# Patient Record
Sex: Male | Born: 1955 | Race: White | Hispanic: No | Smoking: Former smoker
Health system: Southern US, Community
[De-identification: ages and names within clinical notes are randomized; demographics above are authoritative.]

## PROBLEM LIST (undated history)

## (undated) DIAGNOSIS — B019 Varicella without complication: Secondary | ICD-10-CM

## (undated) DIAGNOSIS — A630 Anogenital (venereal) warts: Secondary | ICD-10-CM

## (undated) DIAGNOSIS — F32A Depression, unspecified: Secondary | ICD-10-CM

## (undated) DIAGNOSIS — E785 Hyperlipidemia, unspecified: Secondary | ICD-10-CM

## (undated) DIAGNOSIS — F1910 Other psychoactive substance abuse, uncomplicated: Secondary | ICD-10-CM

## (undated) DIAGNOSIS — F329 Major depressive disorder, single episode, unspecified: Secondary | ICD-10-CM

## (undated) DIAGNOSIS — K219 Gastro-esophageal reflux disease without esophagitis: Secondary | ICD-10-CM

## (undated) DIAGNOSIS — F1010 Alcohol abuse, uncomplicated: Secondary | ICD-10-CM

## (undated) HISTORY — DX: Depression, unspecified: F32.A

## (undated) HISTORY — PX: TONSILLECTOMY AND ADENOIDECTOMY: SUR1326

## (undated) HISTORY — DX: Alcohol abuse, uncomplicated: F10.10

## (undated) HISTORY — DX: Varicella without complication: B01.9

## (undated) HISTORY — DX: Hyperlipidemia, unspecified: E78.5

## (undated) HISTORY — DX: Major depressive disorder, single episode, unspecified: F32.9

## (undated) HISTORY — DX: Anogenital (venereal) warts: A63.0

## (undated) HISTORY — DX: Gastro-esophageal reflux disease without esophagitis: K21.9

## (undated) HISTORY — PX: NOSE SURGERY: SHX723

## (undated) HISTORY — DX: Other psychoactive substance abuse, uncomplicated: F19.10

---
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
## 1996-11-17 HISTORY — PX: CHOLECYSTECTOMY: SHX55

## 1998-06-20 ENCOUNTER — Ambulatory Visit (HOSPITAL_COMMUNITY): Admission: RE | Admit: 1998-06-20 | Discharge: 1998-06-20 | Payer: Self-pay | Admitting: Family Medicine

## 1998-07-16 ENCOUNTER — Encounter: Payer: Self-pay | Admitting: General Surgery

## 1998-07-17 ENCOUNTER — Encounter: Payer: Self-pay | Admitting: General Surgery

## 1998-07-17 ENCOUNTER — Ambulatory Visit (HOSPITAL_COMMUNITY): Admission: RE | Admit: 1998-07-17 | Discharge: 1998-07-18 | Payer: Self-pay | Admitting: General Surgery

## 1999-12-14 ENCOUNTER — Emergency Department (HOSPITAL_COMMUNITY): Admission: EM | Admit: 1999-12-14 | Discharge: 1999-12-14 | Payer: Self-pay

## 2000-02-05 ENCOUNTER — Ambulatory Visit: Admission: RE | Admit: 2000-02-05 | Discharge: 2000-02-05 | Payer: Self-pay | Admitting: Family Medicine

## 2013-08-17 ENCOUNTER — Encounter: Payer: Self-pay | Admitting: Family Medicine

## 2013-08-17 ENCOUNTER — Ambulatory Visit (INDEPENDENT_AMBULATORY_CARE_PROVIDER_SITE_OTHER): Payer: BC Managed Care – PPO | Admitting: Family Medicine

## 2013-08-17 VITALS — BP 110/60 | HR 61 | Temp 97.8°F | Ht 73.0 in | Wt 200.0 lb

## 2013-08-17 DIAGNOSIS — F1910 Other psychoactive substance abuse, uncomplicated: Secondary | ICD-10-CM

## 2013-08-17 DIAGNOSIS — R229 Localized swelling, mass and lump, unspecified: Secondary | ICD-10-CM

## 2013-08-17 DIAGNOSIS — F1911 Other psychoactive substance abuse, in remission: Secondary | ICD-10-CM

## 2013-08-17 DIAGNOSIS — Z8659 Personal history of other mental and behavioral disorders: Secondary | ICD-10-CM

## 2013-08-17 DIAGNOSIS — E785 Hyperlipidemia, unspecified: Secondary | ICD-10-CM | POA: Insufficient documentation

## 2013-08-17 DIAGNOSIS — F1011 Alcohol abuse, in remission: Secondary | ICD-10-CM | POA: Insufficient documentation

## 2013-08-17 DIAGNOSIS — Z0000 Encounter for general adult medical examination without abnormal findings: Secondary | ICD-10-CM

## 2013-08-17 DIAGNOSIS — K219 Gastro-esophageal reflux disease without esophagitis: Secondary | ICD-10-CM | POA: Insufficient documentation

## 2013-08-17 DIAGNOSIS — G4733 Obstructive sleep apnea (adult) (pediatric): Secondary | ICD-10-CM

## 2013-08-17 LAB — POCT URINALYSIS DIPSTICK
Bilirubin, UA: NEGATIVE
Blood, UA: NEGATIVE
Glucose, UA: NEGATIVE
Ketones, UA: NEGATIVE
Leukocytes, UA: NEGATIVE
Nitrite, UA: NEGATIVE
Protein, UA: NEGATIVE
Spec Grav, UA: 1.02
Urobilinogen, UA: 0.2
pH, UA: 6

## 2013-08-17 LAB — CBC WITH DIFFERENTIAL/PLATELET
Basophils Absolute: 0 10*3/uL (ref 0.0–0.1)
Basophils Relative: 0.8 % (ref 0.0–3.0)
Eosinophils Absolute: 0.4 10*3/uL (ref 0.0–0.7)
Eosinophils Relative: 6.1 % — ABNORMAL HIGH (ref 0.0–5.0)
HCT: 44.3 % (ref 39.0–52.0)
Hemoglobin: 15 g/dL (ref 13.0–17.0)
Lymphocytes Relative: 48.2 % — ABNORMAL HIGH (ref 12.0–46.0)
Lymphs Abs: 2.8 10*3/uL (ref 0.7–4.0)
MCHC: 33.9 g/dL (ref 30.0–36.0)
MCV: 93.8 fl (ref 78.0–100.0)
Monocytes Absolute: 0.7 10*3/uL (ref 0.1–1.0)
Monocytes Relative: 11.7 % (ref 3.0–12.0)
Neutro Abs: 1.9 10*3/uL (ref 1.4–7.7)
Neutrophils Relative %: 33.2 % — ABNORMAL LOW (ref 43.0–77.0)
Platelets: 208 10*3/uL (ref 150.0–400.0)
RBC: 4.72 Mil/uL (ref 4.22–5.81)
RDW: 13.1 % (ref 11.5–14.6)
WBC: 5.8 10*3/uL (ref 4.5–10.5)

## 2013-08-17 NOTE — Progress Notes (Signed)
Subjective:    Patient ID: Jose Moss, male    DOB: 07-Sep-1956, 57 y.o.   MRN: [id number]  HPI Patient is seen to establish care and for wellness visit. He has past history of depression currently stable off medication. His GERD symptoms are controlled with over-the-counter medications. He has reported hyperlipidemia but has not been treated with statins. He reports past history of obstructive sleep apnea. He has not used CPAP in years. 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 He remains in a support program.  Patient never had screening colonoscopy. He thinks last tetanus was within the past 5 or 6 years. No recent screening lab work.  Patient is divorced. He has one child. Former smoker. No alcohol or drug use in 10 years.  Family history significant for father with hyperlipidemia and heart disease around age 60   Review of Systems  Constitutional: Negative for fever, activity change, appetite change, fatigue and unexpected weight change.  HENT: Negative for ear pain, congestion and trouble swallowing.   Eyes: Negative for pain and visual disturbance.  Respiratory: Negative for cough, shortness of breath and wheezing.   Cardiovascular: Negative for chest pain and palpitations.  Gastrointestinal: Negative for nausea, vomiting, abdominal pain, diarrhea, constipation, blood in stool, abdominal distention and rectal pain.  Endocrine: Negative for polydipsia and polyuria.  Genitourinary: Negative for dysuria, hematuria and testicular pain.  Musculoskeletal: Negative for joint swelling and arthralgias.  Skin: Negative for rash.  Neurological: Negative for dizziness, syncope and headaches.  Hematological: Negative for adenopathy.  Psychiatric/Behavioral: Negative for confusion and dysphoric mood.       Objective:   Physical Exam  Constitutional: He is  oriented to person, place, and time. He appears well-developed and well-nourished. No distress.  HENT:  Head: Normocephalic and atraumatic.  Right Ear: External ear normal.  Left Ear: External ear normal.  Mouth/Throat: Oropharynx is clear and moist.  Eyes: Conjunctivae and EOM are normal. Pupils are equal, round, and reactive to light.  Neck: Normal range of motion. Neck supple. No thyromegaly present.  Cardiovascular: Normal rate, regular rhythm and normal heart sounds.   No murmur heard. Pulmonary/Chest: No respiratory distress. He has no wheezes. He has no rales.  Abdominal: Soft. Bowel sounds are normal. He exhibits no distension and no mass. There is no tenderness. There is no rebound and no guarding.  Genitourinary: Rectum normal and prostate normal.  Musculoskeletal: He exhibits no edema.  Lymphadenopathy:    He has no cervical adenopathy.  Neurological: He is alert and oriented to person, place, and time. He displays normal reflexes. No cranial nerve deficit.  Skin: No rash noted.  Patient has small slightly nodular lesion right cheek. Just superior to this he has slightly scaly slightly brown colored lesion  Psychiatric: He has a normal mood and affect.          Assessment & Plan:  #1 complete physical. Obtain screening labs. Confirm date of last tetanus. Flu vaccine declined. He wishes to wait on colonoscopy and will call when ready.  #2 history of reported obstructive sleep apnea. Not on CPAP in years. Set up referral to sleep specialist for further evaluation  #3 nodular lesion right face. Rule out basal cell carcinoma. Referral [facility]  #4 history of substance abuse with alcohol and cocaine now absent for 10 years  #5 GERD which is been stable and  controlled with over-the-counter medications  #6 history of hyperlipidemia. Repeat lipids with labs above

## 2013-08-17 NOTE — Patient Instructions (Addendum)
We will call you regarding dermatology and pulmonary (sleep apnea) referrals.

## 2013-08-18 LAB — HEPATIC FUNCTION PANEL
ALT: 19 U/L (ref 0–53)
AST: 18 U/L (ref 0–37)
Albumin: 4.3 g/dL (ref 3.5–5.2)
Alkaline Phosphatase: 64 U/L (ref 39–117)
Bilirubin, Direct: 0.1 mg/dL (ref 0.0–0.3)
Total Bilirubin: 0.6 mg/dL (ref 0.3–1.2)
Total Protein: 6.7 g/dL (ref 6.0–8.3)

## 2013-08-18 LAB — TSH: TSH: 1.62 u[IU]/mL (ref 0.35–5.50)

## 2013-08-18 LAB — LIPID PANEL
Cholesterol: 278 mg/dL — ABNORMAL HIGH (ref 0–200)
HDL: 36.6 mg/dL — ABNORMAL LOW (ref >39.00)
Total CHOL/HDL Ratio: 8
Triglycerides: 72 mg/dL (ref 0.0–149.0)
VLDL: 14.4 mg/dL (ref 0.0–40.0)

## 2013-08-18 LAB — PSA: PSA: 1.76 ng/mL (ref 0.10–4.00)

## 2013-08-18 LAB — BASIC METABOLIC PANEL
BUN: 17 mg/dL (ref 6–23)
CO2: 28 mEq/L (ref 19–32)
Calcium: 9.4 mg/dL (ref 8.4–10.5)
Chloride: 105 mEq/L (ref 96–112)
Creatinine, Ser: 0.9 mg/dL (ref 0.4–1.5)
GFR: 97.78 mL/min (ref >60.00)
Glucose, Bld: 86 mg/dL (ref 70–99)
Potassium: 4.9 mEq/L (ref 3.5–5.1)
Sodium: 138 mEq/L (ref 135–145)

## 2013-08-19 ENCOUNTER — Other Ambulatory Visit: Payer: Self-pay

## 2013-08-19 LAB — LDL CHOLESTEROL, DIRECT: Direct LDL: 232 mg/dL

## 2013-08-19 MED ORDER — ATORVASTATIN CALCIUM 40 MG PO TABS: 40.0000 mg | ORAL_TABLET | Freq: Every day | ORAL | Status: DC

## 2013-08-24 ENCOUNTER — Encounter: Payer: Self-pay | Admitting: Family Medicine

## 2013-09-26 ENCOUNTER — Ambulatory Visit (INDEPENDENT_AMBULATORY_CARE_PROVIDER_SITE_OTHER): Payer: BC Managed Care – PPO | Admitting: Pulmonary Disease

## 2013-09-26 ENCOUNTER — Encounter: Payer: Self-pay | Admitting: Pulmonary Disease

## 2013-09-26 VITALS — BP 108/80 | HR 70 | Temp 97.8°F | Ht 73.5 in | Wt 203.6 lb

## 2013-09-26 DIAGNOSIS — Z23 Encounter for immunization: Secondary | ICD-10-CM

## 2013-09-26 DIAGNOSIS — G4733 Obstructive sleep apnea (adult) (pediatric): Secondary | ICD-10-CM

## 2013-09-26 NOTE — Assessment & Plan Note (Signed)
The patient has a history of obstructive sleep apnea for which he was treated with CPAP.  He discontinued this because of poor tolerance, and subsequently lost 70 pounds.  He was feeling very well with restful sleep, but over the last year he is having more frequent awakenings and nonrestorative sleep.  He also has some sleepiness in the evenings with inactivity.  It is unclear to me whether sleep apnea explains his current symptoms, and the only way to assess this will be with a repeat sleep study.  The patient is agreeable to this approach.

## 2013-09-26 NOTE — Progress Notes (Signed)
Subjective:    Patient ID: Jose Moss, male    DOB: September 06, 1956, 57 y.o.   MRN: [id number]  HPI The patient is a 57 year old male who I've been asked to see for possible obstructive sleep apnea.  The patient was diagnosed with sleep apnea in the late 1990s, but he was almost 70 pounds heavier at that time.  He was started on CPAP, but found it difficult to get on a consistent schedule with the device.  He decided to work on aggressive weight loss, and lost approximately 70 pounds.  The patient did very well initially, but over the last year or so has had a drop in his energy level during the day, and also nocturnal symptoms that he is unable to explain.  He isn't sure if he snores or has apneas with no current bed partner.  He does still have frequent awakenings, and doesn't feel fully rested in the mornings upon arising.  He also describes a sense of worry and anxiety whenever he awakens in the mornings.  The patient denies having inappropriate daytime sleepiness, but will have sleep pressure in the evenings watching television or movies.  He has no sleepiness with driving.  He complains of chronic nasal congestion.  His Epworth score today is 10.   Sleep Questionnaire What time do you typically go to bed?( Between what hours) 10:30-1130 10:30-1130 at 1459 on 09/26/13 by Maisie Fus, CMA How long does it take you to fall asleep?  at 1459 on 09/26/13 by Maisie Fus, CMA How many times during the night do you wake up? 4 4 at 1459 on 09/26/13 by Maisie Fus, CMA What time do you get out of bed to start your day? 0630 0630 at 1459 on 09/26/13 by Maisie Fus, CMA Do you drive or operate heavy machinery in your occupation? No No at 1459 on 09/26/13 by Maisie Fus, CMA How much has your weight changed (up or down) over the past two years? (In pounds) 10 lb (4.536 kg)10 lb (4.536 kg) flucuates at 1459 on 09/26/13 by Maisie Fus, CMA Have you ever had a sleep  study before? Yes Yes at 1459 on 09/26/13 by Maisie Fus, CMA If yes, location of study? [facility] at 1459 on 09/26/13 by Maisie Fus, CMA If yes, date of study? 1998 1998 at 1459 on 09/26/13 by Maisie Fus, CMA Do you currently use CPAP? No No at 1459 on 09/26/13 by Maisie Fus, CMA Do you wear oxygen at any time? No No at 1459 on 09/26/13 by Maisie Fus, CMA   Review of Systems  Constitutional: Negative for fever and unexpected weight change.  HENT: Positive for congestion and dental problem. Negative for ear pain, nosebleeds, postnasal drip, rhinorrhea, sinus pressure, sneezing, sore throat and trouble swallowing.   Eyes: Negative for redness and itching.  Respiratory: Negative for cough, chest tightness, shortness of breath and wheezing.   Cardiovascular: Positive for palpitations ( irregular heartbeat). Negative for leg swelling.  Gastrointestinal: Positive for abdominal pain. Negative for nausea and vomiting.  Genitourinary: Negative for dysuria.  Musculoskeletal: Positive for joint swelling.  Skin: Negative for rash.  Neurological: Negative for headaches.  Hematological: Does not bruise/bleed easily.  Psychiatric/Behavioral: Negative for dysphoric mood. The patient is nervous/anxious.        Objective:   Physical Exam Constitutional:  Well developed, no acute distress  HENT:  Nares narrowed bilat, poor airflow.  Oropharynx without exudate, palate  and uvula very elongated.   Eyes:  Perrla, eomi, no scleral icterus  Neck:  No JVD, no TMG  Cardiovascular:  Normal rate, regular rhythm, no rubs or gallops.  No murmurs        Intact distal pulses  Pulmonary :  Normal breath sounds, no stridor or respiratory distress   No rales, rhonchi, or wheezing  Abdominal:  Soft, nondistended, bowel sounds present.  No tenderness noted.   Musculoskeletal:  No lower extremity edema noted.  Lymph Nodes:  No cervical lymphadenopathy noted  Skin:  No cyanosis  noted  Neurologic:  Alert, appropriate, moves all 4 extremities without obvious deficit.         Assessment & Plan:

## 2013-09-26 NOTE — Patient Instructions (Signed)
Will schedule for sleep study.  Will arrange followup to review once the results are available.

## 2013-09-30 ENCOUNTER — Other Ambulatory Visit (INDEPENDENT_AMBULATORY_CARE_PROVIDER_SITE_OTHER): Payer: BC Managed Care – PPO

## 2013-09-30 DIAGNOSIS — E785 Hyperlipidemia, unspecified: Secondary | ICD-10-CM

## 2013-09-30 LAB — LIPID PANEL
Cholesterol: 141 mg/dL (ref 0–200)
HDL: 31.3 mg/dL — ABNORMAL LOW (ref >39.00)
LDL Cholesterol: 101 mg/dL — ABNORMAL HIGH (ref 0–99)
Total CHOL/HDL Ratio: 5
Triglycerides: 43 mg/dL (ref 0.0–149.0)
VLDL: 8.6 mg/dL (ref 0.0–40.0)

## 2013-09-30 LAB — HEPATIC FUNCTION PANEL
ALT: 30 U/L (ref 0–53)
AST: 23 U/L (ref 0–37)
Albumin: 3.9 g/dL (ref 3.5–5.2)
Alkaline Phosphatase: 72 U/L (ref 39–117)
Bilirubin, Direct: 0 mg/dL (ref 0.0–0.3)
Total Bilirubin: 0.4 mg/dL (ref 0.3–1.2)
Total Protein: 6.9 g/dL (ref 6.0–8.3)

## 2013-10-18 ENCOUNTER — Telehealth: Payer: Self-pay | Admitting: Pulmonary Disease

## 2013-10-18 NOTE — Telephone Encounter (Signed)
Pt upset because no one can give a price of what it will cost him out of pocket for this study and has cancelled it-he was advised to ck his ins we did a precert and one was not required but pt refused to call them himself about his deductable etc which is something we dont know T[place] Sos

## 2013-10-27 ENCOUNTER — Encounter (HOSPITAL_BASED_OUTPATIENT_CLINIC_OR_DEPARTMENT_OTHER): Payer: BC Managed Care – PPO

## 2013-11-08 ENCOUNTER — Telehealth: Payer: Self-pay | Admitting: Family Medicine

## 2013-11-08 NOTE — Telephone Encounter (Signed)
Pt is call requesting last blood work results

## 2013-11-11 NOTE — Telephone Encounter (Signed)
Pt reminded of previous lab results.

## 2013-12-04 ENCOUNTER — Ambulatory Visit (HOSPITAL_BASED_OUTPATIENT_CLINIC_OR_DEPARTMENT_OTHER): Payer: BC Managed Care – PPO | Attending: Pulmonary Disease

## 2013-12-04 VITALS — Ht 73.0 in | Wt 200.0 lb

## 2013-12-04 DIAGNOSIS — G4733 Obstructive sleep apnea (adult) (pediatric): Secondary | ICD-10-CM

## 2013-12-04 DIAGNOSIS — Z6826 Body mass index (BMI) 26.0-26.9, adult: Secondary | ICD-10-CM | POA: Insufficient documentation

## 2013-12-11 DIAGNOSIS — G4733 Obstructive sleep apnea (adult) (pediatric): Secondary | ICD-10-CM

## 2013-12-12 ENCOUNTER — Telehealth: Payer: Self-pay | Admitting: Pulmonary Disease

## 2013-12-12 NOTE — Telephone Encounter (Signed)
appt scheduled with [place] to review sleep study 12/19/13 at 430

## 2013-12-12 NOTE — Sleep Study (Signed)
   NAME: Jose Moss DATE OF BIRTH:  04-03-1956 MEDICAL RECORD NUMBER [id number]  LOCATION: [facility]  PHYSICIAN: [place] OF STUDY: 12/04/2013  SLEEP STUDY TYPE: Nocturnal Polysomnogram               REFERRING PHYSICIAN: Verlan Grotz, Armando Reichert, MD  INDICATION FOR STUDY: hypersomnia with sleep apnea  EPWORTH SLEEPINESS SCORE:  8 HEIGHT: 6\' 1"  (185.4 cm)  WEIGHT: 200 lb (90.719 kg)    Body mass index is 26.39 kg/(m^2).  NECK SIZE: 15 in.  SLEEP ARCHITECTURE: The patient had a total sleep time of 370 minutes with no slow-wave sleep and 79 minutes of REM. Sleep onset latency was normal at 18 minutes, and REM onset was normal at 76 minutes. Sleep efficiency was minimally reduced at 90%.  RESPIRATORY DATA: The patient was found to have 122 apneas and 39 obstructive hypopneas, giving him an apnea-hypopnea index of 26 events per hour. The events occurred all in the supine position there was loud snoring noted throughout.  OXYGEN DATA: There was oxygen desaturation as low as 80% with the patient's obstructive events  CARDIAC DATA: Rare PVC noted, but no clinically significant arrhythmias were seen  MOVEMENT/PARASOMNIA: Moderate numbers of periodic limb movements were noted without significant arousal or awakening. There were no abnormal behaviors seen.  IMPRESSION/ RECOMMENDATION:    1) moderate obstructive sleep apnea/hypopnea syndrome, with an AHI of 26 events per hour and oxygen desaturation as low as 80%. Treatment for this degree of sleep apnea can include a trial of weight loss alone if applicable, upper airway surgery, dental appliance, and also CPAP. Clinical correlation is suggested.  2) rare PVC noted, but no clinically significant arrhythmias were seen.     [place], American Board of Sleep Medicine  ELECTRONICALLY SIGNED ON:  12/12/2013, 8:38 AM [facility] PH: [phone]   FX: [phone]  [phone] [place]

## 2013-12-12 NOTE — Telephone Encounter (Signed)
Needs ov to review sleep study results.  

## 2013-12-19 ENCOUNTER — Encounter: Payer: Self-pay | Admitting: Pulmonary Disease

## 2013-12-19 ENCOUNTER — Ambulatory Visit (INDEPENDENT_AMBULATORY_CARE_PROVIDER_SITE_OTHER): Payer: BC Managed Care – PPO | Admitting: Pulmonary Disease

## 2013-12-19 VITALS — BP 112/76 | HR 91 | Temp 97.7°F | Ht 71.0 in | Wt 207.4 lb

## 2013-12-19 DIAGNOSIS — G4733 Obstructive sleep apnea (adult) (pediatric): Secondary | ICD-10-CM

## 2013-12-19 NOTE — Patient Instructions (Signed)
Will setup on cpap.  Please call if having tolerance issues.  Work on modest weight loss. followup with me again in 8 weeks.

## 2013-12-19 NOTE — Assessment & Plan Note (Signed)
The patient has been found to have moderate obstructive sleep apnea, and I have reviewed the study with him in detail. I have outlined possible treatment options with him, including a trial of CPAP and also a dental appliance. After a long discussion, the patient has agreed to try the CPAP for the next 8 weeks to see if he has significant benefit and is able to tolerate. I've also encouraged him to work aggressively on modest weight loss. I will set the patient up on cpap at a moderate pressure level to allow for desensitization, and will troubleshoot the device over the next 4-6weeks if needed.  The pt is to call me if having issues with tolerance.  Will then optimize the pressure once patient is able to wear cpap on a consistent basis.

## 2013-12-19 NOTE — Progress Notes (Signed)
   Subjective:    Patient ID: Jose Moss, male    DOB: 09/18/1956, 58 y.o.   MRN: [id number]  HPI The patient comes in today for followup after his recent sleep study. He was found to have moderate OSA, with an AHI of 26 events per hour and oxygen desaturation as low as 80%. I have reviewed the study with him in detail, and answered all of his questions.    Review of Systems  Constitutional: Negative for fever and unexpected weight change.  HENT: Negative for congestion, dental problem, ear pain, nosebleeds, postnasal drip, rhinorrhea, sinus pressure, sneezing, sore throat and trouble swallowing.   Eyes: Negative for redness and itching.  Respiratory: Negative for cough, chest tightness, shortness of breath and wheezing.   Cardiovascular: Negative for palpitations and leg swelling.  Gastrointestinal: Negative for nausea and vomiting.  Genitourinary: Negative for dysuria.  Musculoskeletal: Negative for joint swelling.  Skin: Negative for rash.  Neurological: Negative for headaches.  Hematological: Does not bruise/bleed easily.  Psychiatric/Behavioral: Negative for dysphoric mood. The patient is not nervous/anxious.        Objective:   Physical Exam Overweight male in nad Nose without purulence or d/c noted No skin breakdown or pressure necrosis from cpap mask. Neck without LN or TMG LE without edema, no cyanosis.  Alert and oriented, moves all 4.        Assessment & Plan:

## 2013-12-23 ENCOUNTER — Other Ambulatory Visit: Payer: Self-pay | Admitting: Pulmonary Disease

## 2013-12-23 ENCOUNTER — Telehealth: Payer: Self-pay | Admitting: Pulmonary Disease

## 2013-12-23 DIAGNOSIS — G4733 Obstructive sleep apnea (adult) (pediatric): Secondary | ICD-10-CM

## 2013-12-23 NOTE — Telephone Encounter (Signed)
Order sent for dental appliance.

## 2013-12-23 NOTE — Telephone Encounter (Signed)
We received a MyChart message from the pt. He wants to cancel the order for CPAP and try an oral appliance.  [place] - please advise. Thanks.

## 2014-02-10 ENCOUNTER — Encounter: Payer: Self-pay | Admitting: Pulmonary Disease

## 2014-02-13 ENCOUNTER — Ambulatory Visit: Payer: BC Managed Care – PPO | Admitting: Pulmonary Disease

## 2014-03-07 ENCOUNTER — Encounter: Payer: Self-pay | Admitting: Pulmonary Disease

## 2014-03-08 NOTE — Telephone Encounter (Signed)
Will make krista aware of this.

## 2014-03-08 NOTE — Telephone Encounter (Signed)
Called spoke with Daleen Snook who reported that she is who pt needs to speak with regarding this. Per Daleen Snook, she has left a message for pt to call her back. Mychart message sent to pt to let him know this

## 2014-03-09 NOTE — Telephone Encounter (Signed)
Spoke with patient in regards to his [employer]. Patient stated he would have [place] call our office.  Nothing further was needed at this time.

## 2014-03-09 NOTE — Telephone Encounter (Signed)
Called patient again to talk to him about his bill. Unable to document billing information in patients chart.  Left message on machine for patient to call back and ask for me.

## 2014-03-28 ENCOUNTER — Encounter: Payer: Self-pay | Admitting: Pulmonary Disease

## 2014-03-28 DIAGNOSIS — G4733 Obstructive sleep apnea (adult) (pediatric): Secondary | ICD-10-CM

## 2014-03-28 NOTE — Telephone Encounter (Signed)
Per order 12/19/13: Note resmed s10 air/auto at 10cm with heated humidifier and climate control tubing. Needs full face mask of choice Enroll in airview program Not advanced or [facility]ria --   Pt now wanting the order for CPAP to be re-sent since insurance denied the dental appliance. Please advise if pt needs OV or if we can just place order again [facility]? thanks

## 2014-03-29 ENCOUNTER — Telehealth: Payer: Self-pay | Admitting: Pulmonary Disease

## 2014-03-29 NOTE — Telephone Encounter (Signed)
Ok to start cpap as I ordered last visit.  He needs to see me in 8 weeks, but call if having tolerance issues.

## 2014-03-29 NOTE — Telephone Encounter (Signed)
Let pt know that he needs to see me 8 weeks after his cpap is started.  Thanks.

## 2014-03-30 NOTE — Telephone Encounter (Signed)
Pt advised and states he will call when he is set-up.[place] Bing, CMA

## 2014-04-13 NOTE — Telephone Encounter (Signed)
Pt was set up with [facility]. They are giving him a hard time about money. Would like to be set up with another DME.

## 2014-04-13 NOTE — Addendum Note (Signed)
Addended by: Desmond Dike C on: 04/13/2014 02:57 PM   Modules accepted: Orders

## 2014-08-21 ENCOUNTER — Other Ambulatory Visit: Payer: Self-pay | Admitting: Family Medicine

## 2014-10-16 ENCOUNTER — Ambulatory Visit: Payer: BC Managed Care – PPO | Admitting: Family Medicine

## 2014-10-30 ENCOUNTER — Encounter: Payer: Self-pay | Admitting: Family Medicine

## 2014-10-30 ENCOUNTER — Ambulatory Visit (INDEPENDENT_AMBULATORY_CARE_PROVIDER_SITE_OTHER): Payer: BC Managed Care – PPO | Admitting: Family Medicine

## 2014-10-30 VITALS — BP 126/80 | HR 74 | Temp 97.6°F | Wt 203.0 lb

## 2014-10-30 DIAGNOSIS — R5383 Other fatigue: Secondary | ICD-10-CM

## 2014-10-30 DIAGNOSIS — R0609 Other forms of dyspnea: Secondary | ICD-10-CM

## 2014-10-30 DIAGNOSIS — E785 Hyperlipidemia, unspecified: Secondary | ICD-10-CM

## 2014-10-30 DIAGNOSIS — R0600 Dyspnea, unspecified: Secondary | ICD-10-CM

## 2014-10-30 NOTE — Progress Notes (Signed)
   Subjective:    Patient ID: Jose Moss, male    DOB: 21-May-1956, 58 y.o.   MRN: [id number]  HPI Patient seen with nonspecific complaints of fatigue and lethargy especially with exercise and activities. He does not exercise regularly but has noticed in recent months with things like walking he has increased fatigue and sometimes dyspnea with exertion. He has never had any chest pain. He was concerned because his father had CABG age 52. Patient is an ex-smoker after about 40 pack year history. He quit smoking around age 64. Patient does not have any history of known COPD. No recent cough.  He had several labs last year that were unremarkable with exception of severe hyperlipidemia. He takes aspirin and Lipitor. He has history of obstructive sleep apnea and is currently not treated with CPAP. He day denies any daytime somnolence. Occasional has nocturia- 2-3 times at night with urgency but no obstructive symptoms. Denies any active depression symptoms. No dietary changes.  Past Medical History  Diagnosis Date  . Alcohol abuse   . Drug abuse   . Chicken pox   . Depression   . Hyperlipidemia   . GERD (gastroesophageal reflux disease)   . Genital warts    Past Surgical History  Procedure Laterality Date  . Cholecystectomy  1998  . Appendectomy  1976  . Nose surgery      reports that he quit smoking about 7 years ago. His smoking use included Cigarettes. He has a 40 pack-year smoking history. He has never used smokeless tobacco. He reports that he does not drink alcohol or use illicit drugs. family history includes COPD in his mother; Heart disease in his father; Hyperlipidemia in his father. No Known Allergies    Review of Systems  Constitutional: Positive for fatigue. Negative for fever, chills, appetite change and unexpected weight change.  Respiratory: Positive for shortness of breath. Negative for cough.   Cardiovascular: Negative for chest pain, palpitations and leg  swelling.  Gastrointestinal: Negative for nausea, vomiting, abdominal pain, diarrhea and blood in stool.  Endocrine: Negative for cold intolerance, heat intolerance, polydipsia and polyuria.  Genitourinary: Positive for urgency. Negative for dysuria.  Neurological: Negative for headaches.  Hematological: Negative for adenopathy.       Objective:   Physical Exam  Constitutional: He appears well-developed and well-nourished. No distress.  HENT:  Mouth/Throat: Oropharynx is clear and moist.  Neck: Neck supple. No thyromegaly present.  Cardiovascular: Normal rate and regular rhythm.  Exam reveals no gallop and no friction rub.   Pulmonary/Chest: Effort normal and breath sounds normal. No respiratory distress. He has no wheezes. He has no rales.  Abdominal: Soft. Bowel sounds are normal. He exhibits no distension and no mass. There is no tenderness. There is no rebound and no guarding.  Musculoskeletal: He exhibits no edema.  Lymphadenopathy:    He has no cervical adenopathy.  Psychiatric: He has a normal mood and affect. His behavior is normal.          Assessment & Plan:  Progressive exertional dyspnea. No history of heart failure. He's never had any chest pain but does have risk factors including age, family history, dyslipidemia. He's never had any sort of cardiac testing. Set up nuclear stress test. Schedule complete physical and obtain repeat labs. If cardiac stress testing normal consider spirometry to rule out COPD.  EKG here today in office shows sinus rhythm with no acute changes

## 2014-10-30 NOTE — Patient Instructions (Signed)
We will call you with stress test.  I will be setting this up. Follow up promptly for any chest pain or worsening symptoms. Consider follow up complete physical this year.

## 2014-10-30 NOTE — Progress Notes (Signed)
Pre visit review using our clinic review tool, if applicable. No additional management support is needed unless otherwise documented below in the visit note. 

## 2014-11-01 ENCOUNTER — Encounter: Payer: Self-pay | Admitting: Family Medicine

## 2014-11-03 ENCOUNTER — Telehealth: Payer: Self-pay | Admitting: Family Medicine

## 2014-11-03 NOTE — Telephone Encounter (Signed)
Pt was scheduled for a MYOCARDIAL PERFUSION IMAGING  This appt was scheduled for 11/07/2014@9 :00 an authorization was done  Authorization # [id number] valid dates -10/31/2014-11/29/2014. I called pt on 10-31-2014 left him a msg to call regarding scheduled appt time and date . received no call back . Pt called [facility] 11/03/2014 to see why his appt was canceled was told Cancel Rsn: Patient (due to insurance coverage . ( PT CANCELED). I  then called the patient and ask if he wanted to reschedule his appointment and wanted to know his reasons of why he canceled his appointment for his stress test . I was told by the patient " He was going to have to pay a high deductible of $500 and he was told by [facility] heartcare staff that this was an Dr visit not an outpatient visit and it would take about 15 min  to an hour to schedule ". Pt states he was told conflicting  information regarding this test and he stated he doesn't know why it has to to billed under the [place] system and why can't he pick and choose on where he wants to go . Pt stated " that he feels like  [facility] is making money off him and he does not  like the [place] system ".  I informed the patient that his authorization was submitted  and approve  , and if he wants to go else where I [place] arrange for him to have done at another imaging faculity . Pt was very aggressive and very debatable on the phone with me . I could not get a word in to talk to pt and see how I could accommodate him and get him rescheduled, he was arguing and then he hung up on me . He was very impatient and very difficult to deal with ,

## 2014-11-04 ENCOUNTER — Other Ambulatory Visit: Payer: Self-pay | Admitting: Family Medicine

## 2014-11-07 ENCOUNTER — Encounter (HOSPITAL_COMMUNITY): Payer: BC Managed Care – PPO

## 2015-01-16 ENCOUNTER — Encounter: Payer: Self-pay | Admitting: Family Medicine

## 2015-01-17 ENCOUNTER — Other Ambulatory Visit: Payer: Self-pay | Admitting: Family Medicine

## 2015-01-17 DIAGNOSIS — Z1211 Encounter for screening for malignant neoplasm of colon: Secondary | ICD-10-CM

## 2015-01-18 ENCOUNTER — Encounter: Payer: Self-pay | Admitting: Gastroenterology

## 2015-01-29 ENCOUNTER — Telehealth: Payer: Self-pay

## 2015-01-29 MED ORDER — ATORVASTATIN CALCIUM 40 MG PO TABS: 40.0000 mg | ORAL_TABLET | Freq: Every day | ORAL | Status: DC

## 2015-01-29 NOTE — Telephone Encounter (Signed)
[facility] refill request for ATORVASTATIN 40MG  TAB  I added this pharmacy to list

## 2015-01-29 NOTE — Telephone Encounter (Signed)
Rx sent to pharmacy   

## 2015-03-05 ENCOUNTER — Ambulatory Visit (AMBULATORY_SURGERY_CENTER): Payer: Self-pay | Admitting: *Deleted

## 2015-03-05 VITALS — Ht 73.5 in | Wt 205.0 lb

## 2015-03-05 DIAGNOSIS — Z1211 Encounter for screening for malignant neoplasm of colon: Secondary | ICD-10-CM

## 2015-03-05 MED ORDER — NA SULFATE-K SULFATE-MG SULF 17.5-3.13-1.6 GM/177ML PO SOLN: 1.0000 | Freq: Once | ORAL | Status: DC

## 2015-03-05 NOTE — Progress Notes (Signed)
No egg or soy allergy. No anesthesia problems.  No home O2.  No diet meds.  

## 2015-03-13 ENCOUNTER — Telehealth: Payer: Self-pay | Admitting: Gastroenterology

## 2015-03-13 ENCOUNTER — Encounter: Payer: Self-pay | Admitting: Gastroenterology

## 2015-03-13 NOTE — Telephone Encounter (Signed)
Offered patient free Suprep kit. Told him I will leave it out front for him to pick. Pt verbalized understanding. Patient has questions about his insurance and I told patient I will send this note to our pre-cert coordinator (Es) to call him.

## 2015-03-19 ENCOUNTER — Ambulatory Visit (AMBULATORY_SURGERY_CENTER): Payer: 59 | Admitting: Gastroenterology

## 2015-03-19 ENCOUNTER — Encounter: Payer: Self-pay | Admitting: Gastroenterology

## 2015-03-19 VITALS — BP 93/65 | HR 60 | Temp 97.4°F | Resp 13 | Ht 73.5 in | Wt 205.0 lb

## 2015-03-19 DIAGNOSIS — D123 Benign neoplasm of transverse colon: Secondary | ICD-10-CM | POA: Diagnosis not present

## 2015-03-19 DIAGNOSIS — Z1211 Encounter for screening for malignant neoplasm of colon: Secondary | ICD-10-CM

## 2015-03-19 MED ORDER — SODIUM CHLORIDE 0.9 % IV SOLN: 500.0000 mL | INTRAVENOUS | Status: DC

## 2015-03-19 NOTE — Op Note (Addendum)
[place]  [address] [place], [id number]   COLONOSCOPY PROCEDURE REPORT  PATIENT: Jose Moss, Jose Moss  MR#: [id number] BIRTHDATE: [birth year] , 48  yrs. old GENDER: male ENDOSCOPIST: Ladene Artist, MD, [facility] REFERRED RA:JHHID Elease Hashimoto, M.D. PROCEDURE DATE:  03/19/2015 PROCEDURE:   Colonoscopy, screening and Colonoscopy with snare polypectomy First Screening Colonoscopy - Avg.  risk and is 50 yrs.  old or older Yes.  Prior Negative Screening - Now for repeat screening. N/A  History of Adenoma - Now for follow-up colonoscopy & has been > or = to 3 yrs.  N/A ASA CLASS:   Class II INDICATIONS:Screening for colonic neoplasia and Colorectal Neoplasm Risk Assessment for this procedure is average risk. MEDICATIONS: Monitored anesthesia care, Propofol 400 mg IV, and Glucagon 1 mg IV DESCRIPTION OF PROCEDURE:   After the risks benefits and alternatives of the procedure were thoroughly explained, informed consent was obtained.  The digital rectal exam revealed no abnormalities of the rectum.   The LB [id number]  endoscope was introduced through the anus and advanced to the cecum, which was identified by both the appendix and ileocecal valve. No adverse events experienced with a tortuous and redundant colon.   The quality of the prep was adequate after extensive rinsing and suctioning (Suprep was used) . Glucogan given for spasm.  The instrument was then slowly withdrawn as the colon was fully examined.    COLON FINDINGS: A sessile polyp measuring 7 mm in size was found in the transverse colon.  A polypectomy was performed with cold snare.  The resection was complete, the polyp tissue was completely retrieved and sent to histology.  There was mild diverticulosis noted in the descending colon, sigmoid colon, ascending colon, and transverse colon.   The examination was otherwise normal. Retroflexed views revealed no abnormalities. The time to cecum = 5.9  Withdrawal time = 19.1   The scope was withdrawn and the procedure completed. COMPLICATIONS: There were no immediate complications.  ENDOSCOPIC IMPRESSION: 1.   Sessile polyp in the transverse colon; polypectomy performed with cold forceps 2.   Mild diverticulosis in the descending colon, sigmoid colon, ascending colon, and transverse colon  RECOMMENDATIONS: 1.  Await pathology results 2.  Repeat colonoscopy with a more extensive bowel prep in 3 years if polyp adenomatous; otherwise 7 years. Shorter intervals due to colon spasm, adequate prep and tortuous colon 3.  High fiber diet with liberal fluid intake.  eSigned:  Ladene Artist, MD, [facility] 03/19/2015 1:51 PM Revised: 03/19/2015 1:51 PM  [C

## 2015-03-19 NOTE — Patient Instructions (Signed)
YOU HAD AN ENDOSCOPIC PROCEDURE TODAY AT [facility]:   Refer to the procedure report that was given to you for any specific questions about what was found during the examination.  If the procedure report does not answer your questions, please call your gastroenterologist to clarify.  If you requested that your care partner not be given the details of your procedure findings, then the procedure report has been included in a sealed envelope for you to review at your convenience later.  YOU SHOULD EXPECT: Some feelings of bloating in the abdomen. Passage of more gas than usual.  Walking can help get rid of the air that was put into your GI tract during the procedure and reduce the bloating. If you had a lower endoscopy (such as a colonoscopy or flexible sigmoidoscopy) you may notice spotting of blood in your stool or on the toilet paper. If you underwent a bowel prep for your procedure, you may not have a normal bowel movement for a few days.  Please Note:  You might notice some irritation and congestion in your nose or some drainage.  This is from the oxygen used during your procedure.  There is no need for concern and it should clear up in a day or so.  SYMPTOMS TO REPORT IMMEDIATELY:   Following lower endoscopy (colonoscopy or flexible sigmoidoscopy):  Excessive amounts of blood in the stool  Significant tenderness or worsening of abdominal pains  Swelling of the abdomen that is new, acute  Fever of 100F or higher   For urgent or emergent issues, a gastroenterologist can be reached at any hour by calling [phone].   DIET: Your first meal following the procedure should be a small meal and then it is ok to progress to your normal diet. Heavy or fried foods are harder to digest and may make you feel nauseous or bloated.  Likewise, meals heavy in dairy and vegetables can increase bloating.  Drink plenty of fluids but you should avoid alcoholic beverages for 24  hours.  ACTIVITY:  You should plan to take it easy for the rest of today and you should NOT DRIVE or use heavy machinery until tomorrow (because of the sedation medicines used during the test).    FOLLOW UP: Our staff will call the number listed on your records the next business day following your procedure to check on you and address any questions or concerns that you may have regarding the information given to you following your procedure. If we do not reach you, we will leave a message.  However, if you are feeling well and you are not experiencing any problems, there is no need to return our call.  We will assume that you have returned to your regular daily activities without incident.  If any biopsies were taken you will be contacted by phone or by letter within the next 1-3 weeks.  Please call us at [phone] if you have not heard about the biopsies in 3 weeks.    SIGNATURES/CONFIDENTIALITY: You and/or your care partner have signed paperwork which will be entered into your electronic medical record.  These signatures attest to the fact that that the information above on your After Visit Summary has been reviewed and is understood.  Full responsibility of the confidentiality of this discharge information lies with you and/or your care-partner.  Polyps, diverticulosis, high fiber diet-handouts given  Repeat colonoscopy will be determined by pathology.

## 2015-03-19 NOTE — Progress Notes (Signed)
Called to room to assist during endoscopic procedure.  Patient ID and intended procedure confirmed with present staff. Received instructions for my participation in the procedure from the performing physician.  

## 2015-03-19 NOTE — Progress Notes (Signed)
To recovery, report to Mirts, RN, VSS. 

## 2015-03-20 ENCOUNTER — Telehealth: Payer: Self-pay | Admitting: *Deleted

## 2015-03-20 NOTE — Telephone Encounter (Signed)
  Follow up Call-  Call back number 03/19/2015  Post procedure Call Back phone  # [phone]  Permission to leave phone message Yes     Patient questions:  Do you have a fever, pain , or abdominal swelling? No. Pain Score  0 *  Have you tolerated food without any problems? Yes.    Have you been able to return to your normal activities? Yes.    Do you have any questions about your discharge instructions: Diet   No. Medications  No. Follow up visit  No.  Do you have questions or concerns about your Care? No.  Actions: * If pain score is 4 or above: No action needed, pain <4.

## 2015-03-22 ENCOUNTER — Encounter: Payer: Self-pay | Admitting: Gastroenterology

## 2015-05-24 ENCOUNTER — Other Ambulatory Visit (INDEPENDENT_AMBULATORY_CARE_PROVIDER_SITE_OTHER): Payer: 59

## 2015-05-24 DIAGNOSIS — Z0000 Encounter for general adult medical examination without abnormal findings: Secondary | ICD-10-CM

## 2015-05-24 LAB — CBC WITH DIFFERENTIAL/PLATELET
Basophils Absolute: 0 10*3/uL (ref 0.0–0.1)
Basophils Relative: 0.7 % (ref 0.0–3.0)
Eosinophils Absolute: 0.3 10*3/uL (ref 0.0–0.7)
Eosinophils Relative: 4.7 % (ref 0.0–5.0)
HCT: 48 % (ref 39.0–52.0)
Hemoglobin: 15.7 g/dL (ref 13.0–17.0)
Lymphocytes Relative: 41.2 % (ref 12.0–46.0)
Lymphs Abs: 2.4 10*3/uL (ref 0.7–4.0)
MCHC: 32.8 g/dL (ref 30.0–36.0)
MCV: 97 fl (ref 78.0–100.0)
Monocytes Absolute: 0.7 10*3/uL (ref 0.1–1.0)
Monocytes Relative: 11.5 % (ref 3.0–12.0)
Neutro Abs: 2.4 10*3/uL (ref 1.4–7.7)
Neutrophils Relative %: 41.9 % — ABNORMAL LOW (ref 43.0–77.0)
Platelets: 212 10*3/uL (ref 150.0–400.0)
RBC: 4.95 Mil/uL (ref 4.22–5.81)
RDW: 13.7 % (ref 11.5–15.5)
WBC: 5.7 10*3/uL (ref 4.0–10.5)

## 2015-05-24 LAB — BASIC METABOLIC PANEL
BUN: 24 mg/dL — ABNORMAL HIGH (ref 6–23)
CO2: 28 mEq/L (ref 19–32)
Calcium: 9.2 mg/dL (ref 8.4–10.5)
Chloride: 108 mEq/L (ref 96–112)
Creatinine, Ser: 1.02 mg/dL (ref 0.40–1.50)
GFR: 79.8 mL/min (ref >60.00)
Glucose, Bld: 92 mg/dL (ref 70–99)
Potassium: 5.6 mEq/L — ABNORMAL HIGH (ref 3.5–5.1)
Sodium: 141 mEq/L (ref 135–145)

## 2015-05-24 LAB — LIPID PANEL
Cholesterol: 210 mg/dL — ABNORMAL HIGH (ref 0–200)
HDL: 32.6 mg/dL — ABNORMAL LOW (ref >39.00)
LDL Cholesterol: 159 mg/dL — ABNORMAL HIGH (ref 0–99)
NonHDL: 177.4
Total CHOL/HDL Ratio: 6
Triglycerides: 94 mg/dL (ref 0.0–149.0)
VLDL: 18.8 mg/dL (ref 0.0–40.0)

## 2015-05-24 LAB — HEPATIC FUNCTION PANEL
ALT: 17 U/L (ref 0–53)
AST: 17 U/L (ref 0–37)
Albumin: 3.5 g/dL (ref 3.5–5.2)
Alkaline Phosphatase: 52 U/L (ref 39–117)
Bilirubin, Direct: 0.1 mg/dL (ref 0.0–0.3)
Total Bilirubin: 0.5 mg/dL (ref 0.2–1.2)
Total Protein: 5.6 g/dL — ABNORMAL LOW (ref 6.0–8.3)

## 2015-05-24 LAB — PSA: PSA: 1.85 ng/mL (ref 0.10–4.00)

## 2015-05-24 LAB — TSH: TSH: 1.97 u[IU]/mL (ref 0.35–4.50)

## 2015-05-30 ENCOUNTER — Ambulatory Visit (INDEPENDENT_AMBULATORY_CARE_PROVIDER_SITE_OTHER): Payer: 59 | Admitting: Family Medicine

## 2015-05-30 ENCOUNTER — Encounter: Payer: Self-pay | Admitting: Family Medicine

## 2015-05-30 VITALS — BP 124/78 | HR 74 | Temp 98.1°F | Ht 73.5 in | Wt 200.0 lb

## 2015-05-30 DIAGNOSIS — Z0000 Encounter for general adult medical examination without abnormal findings: Secondary | ICD-10-CM | POA: Diagnosis not present

## 2015-05-30 DIAGNOSIS — Z23 Encounter for immunization: Secondary | ICD-10-CM | POA: Diagnosis not present

## 2015-05-30 NOTE — Progress Notes (Signed)
   Subjective:    Patient ID: Jose Moss, male    DOB: 11-Dec-1955, 59 y.o.   MRN: [id number]  HPI Patient here for complete physical. He takes Lipitor for hyperlipidemia. He had some vague lower abdominal discomfort recently and stop Lipitor but symptoms did not improve. They did improve after large bowel movement. He had colonoscopy recently with adenomatous polyp and 3 year recommended follow-up.  He quit taking Lipitor as above recently. His father had CAD in his mid 67s. Patient is nonsmoker. He walks some for exercise. Last tetanus unknown.  Past Medical History  Diagnosis Date  . Alcohol abuse   . Drug abuse   . Chicken pox   . Depression   . Hyperlipidemia   . GERD (gastroesophageal reflux disease)   . Genital warts    Past Surgical History  Procedure Laterality Date  . Cholecystectomy  1998  . Appendectomy  1976  . Nose surgery    . Tonsillectomy and adenoidectomy      reports that he quit smoking about 8 years ago. His smoking use included Cigarettes. He has a 40 pack-year smoking history. He has never used smokeless tobacco. He reports that he does not drink alcohol or use illicit drugs. family history includes COPD in his mother; Heart disease in his father; Hyperlipidemia in his father. There is no history of Colon cancer. No Known Allergies    Review of Systems  Constitutional: Negative for fever, activity change, appetite change, fatigue and unexpected weight change.  HENT: Negative for congestion, ear pain and trouble swallowing.   Eyes: Negative for pain and visual disturbance.  Respiratory: Negative for cough, shortness of breath and wheezing.   Cardiovascular: Negative for chest pain and palpitations.  Gastrointestinal: Negative for nausea, vomiting, abdominal pain, diarrhea, constipation, blood in stool, abdominal distention and rectal pain.  Endocrine: Negative for polydipsia and polyuria.  Genitourinary: Negative for dysuria, hematuria and  testicular pain.  Musculoskeletal: Negative for joint swelling and arthralgias.  Skin: Negative for rash.  Neurological: Negative for dizziness, syncope and headaches.  Hematological: Negative for adenopathy.  Psychiatric/Behavioral: Negative for confusion and dysphoric mood.       Objective:   Physical Exam  Constitutional: He is oriented to person, place, and time. He appears well-developed and well-nourished. No distress.  HENT:  Head: Normocephalic and atraumatic.  Right Ear: External ear normal.  Left Ear: External ear normal.  Mouth/Throat: Oropharynx is clear and moist.  Eyes: Conjunctivae and EOM are normal. Pupils are equal, round, and reactive to light.  Neck: Normal range of motion. Neck supple. No thyromegaly present.  Cardiovascular: Normal rate, regular rhythm and normal heart sounds.   No murmur heard. Pulmonary/Chest: No respiratory distress. He has no wheezes. He has no rales.  Abdominal: Soft. Bowel sounds are normal. He exhibits no distension and no mass. There is no tenderness. There is no rebound and no guarding.  Musculoskeletal: He exhibits no edema.  Lymphadenopathy:    He has no cervical adenopathy.  Neurological: He is alert and oriented to person, place, and time. He displays normal reflexes. No cranial nerve deficit.  Skin: No rash noted.  Psychiatric: He has a normal mood and affect.          Assessment & Plan:  Complete physical. Labs reviewed. Potassium minimally elevated at 5.6 and suspect hemolyzed. He does not take any supplements. His lipids are up and we have recommended he go back on Lipitor which he will do. Tetanus booster given

## 2015-05-30 NOTE — Progress Notes (Signed)
Pre visit review using our clinic review tool, if applicable. No additional management support is needed unless otherwise documented below in the visit note. 

## 2015-05-30 NOTE — Addendum Note (Signed)
Addended by: Marcina Millard on: 05/30/2015 03:02 PM   Modules accepted: Orders

## 2015-12-19 ENCOUNTER — Other Ambulatory Visit: Payer: Self-pay | Admitting: Family Medicine

## 2015-12-19 DIAGNOSIS — R1030 Lower abdominal pain, unspecified: Secondary | ICD-10-CM

## 2015-12-20 ENCOUNTER — Ambulatory Visit (INDEPENDENT_AMBULATORY_CARE_PROVIDER_SITE_OTHER): Payer: Self-pay | Admitting: Cardiology

## 2015-12-20 ENCOUNTER — Encounter: Payer: Self-pay | Admitting: Cardiology

## 2015-12-20 VITALS — BP 118/80 | HR 64 | Ht 73.0 in | Wt 212.2 lb

## 2015-12-20 DIAGNOSIS — R079 Chest pain, unspecified: Secondary | ICD-10-CM | POA: Diagnosis not present

## 2015-12-20 NOTE — Patient Instructions (Signed)
Medication Instructions: No Change  Labwork: None  Procedures/Testing: Your physician has requested that you have en exercise stress myoview. For further information please visit [email or web address]. Please follow instruction sheet, as given.    Follow-Up: To be determined after stress test   If you need a refill on your cardiac medications before your next appointment, please call your pharmacy.     Any Additional Special Instructions Will Be Listed Below (If Applicable).     If you need a refill on your cardiac medications before your next appointment, please call your pharmacy.

## 2015-12-20 NOTE — Progress Notes (Signed)
Electrophysiology Office Note   Date:  12/20/2015   ID:  Jose Moss, DOB [birth year], MRN [id number]  PCP:  Eulas Post, MD  Cardiologist:  Davis Ambrosini Meredith Leeds, MD    No chief complaint on file.    History of Present Illness: Jose Moss is a 60 y.o. male who presents today for electrophysiology evaluation.   He presents today with chest pain. He says the pain occurs mostly at night. He wakes up at times short of breath and feels better when he stands up. The pain is a pressure type pain that occurs in the center of his chest and sometimes radiates to his neck and causes him to feel like he's choking. He works as a Animator and is able to exert himself with minimal chest pain or shortness of breath walking on flat ground or climbing stairs. He sleeps on one pillow, and has been told in the past that he has mild sleep apnea. He says that he had reflux years ago but he has lost weight and gotten healthier. 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   Today, he denies symptoms of palpitations, lower extremity edema, claudication, dizziness, presyncope, syncope, bleeding, or neurologic sequela. The patient is tolerating medications without difficulties and is otherwise without complaint today.    Past Medical History  Diagnosis Date  . Alcohol abuse   . Drug abuse   . Chicken pox   . Depression   . Hyperlipidemia   . GERD (gastroesophageal reflux disease)   . Genital warts    Past Surgical History  Procedure Laterality Date  . Cholecystectomy  1998  . Appendectomy  1976  . Nose surgery    . Tonsillectomy and adenoidectomy       Current Outpatient Prescriptions  Medication Sig Dispense Refill  . aspirin 81 MG tablet Take 81 mg by mouth daily.    Marland Kitchen atorvastatin (LIPITOR) 40 MG tablet Take 1 tablet (40 mg total) by mouth daily. (Patient not taking: Reported on 05/30/2015) 30  tablet 5   No current facility-administered medications for this visit.    Allergies:   Review of patient's allergies indicates no known allergies.   Social History:  The patient  reports that he quit smoking about 8 years ago. His smoking use included Cigarettes. He has a 40 pack-year smoking history. He has never used smokeless tobacco. He reports that he does not drink alcohol or use illicit drugs.   Family History:  The patient's family history includes COPD in his mother; Heart disease in his father; Hyperlipidemia in his father. There is no history of Colon cancer.    ROS:  Please see the history of present illness.   Otherwise, review of systems is positive for chest pain, orthopnea, fatigue, anxiety, back pain.   All other systems are reviewed and negative.    PHYSICAL EXAM: VS:  There were no vitals taken for this visit. , BMI There is no weight on file to calculate BMI. GEN: Well nourished, well developed, in no acute distress HEENT: normal Neck: no JVD, carotid bruits, or masses Cardiac: RRR; no murmurs, rubs, or gallops,no edema  Respiratory:  clear to auscultation bilaterally, normal work of breathing GI: soft, nontender, nondistended, + BS MS: no deformity or atrophy Skin: warm and dry Neuro:  Strength and sensation are intact Psych: euthymic mood, full affect  EKG:  EKG  is ordered today. The ekg ordered today shows sinus rhythm, rate 64  Recent Labs: 05/24/2015: ALT 17; BUN 24*; Creatinine, Ser 1.02; Hemoglobin 15.7; Platelets 212.0; Potassium 5.6*; Sodium 141; TSH 1.97    Lipid Panel     Component Value Date/Time   CHOL 210* 05/24/2015 0847   TRIG 94.0 05/24/2015 0847   HDL 32.60* 05/24/2015 0847   CHOLHDL 6 05/24/2015 0847   VLDL 18.8 05/24/2015 0847   LDLCALC 159* 05/24/2015 0847   LDLDIRECT 232.0 08/17/2013 1206     Wt Readings from Last 3 Encounters:  05/30/15 200 lb (90.719 kg)  03/19/15 205 lb (92.987 kg)  03/05/15 205 lb (92.987 kg)      ASSESSMENT AND PLAN:  1.  Chest pain: Pain has both typical and atypical features. The nature of the pain is quite typical with a crushing substernal pain that radiates to the neck but it is not associated with exertion. Due to the constellation of symptoms, we'll order a nuclear stress test to determine if his pain is cardiac. This Renzo Vincelette also give [place] a measure of his ejection fraction to help determine if his shortness of breath at night is related to a low EF.  Current medicines are reviewed at length with the patient today.   The patient does not have concerns regarding his medicines.  The following changes were made today:  none  Labs/ tests ordered today include:  No orders of the defined types were placed in this encounter.     Disposition:   FU with Carinne Brandenburger PRN  Signed, Yvon Mccord Meredith Leeds, MD  12/20/2015 1:24 PM     [place] [address] [place] [address] [phone] (office) [phone] (fax) more chest y

## 2015-12-21 ENCOUNTER — Ambulatory Visit
Admission: RE | Admit: 2015-12-21 | Discharge: 2015-12-21 | Disposition: A | Discharge status: Home / Self Care | Payer: BLUE CROSS/BLUE SHIELD | Source: Ambulatory Visit | Attending: Family Medicine | Admitting: Family Medicine

## 2015-12-21 DIAGNOSIS — R1030 Lower abdominal pain, unspecified: Secondary | ICD-10-CM

## 2015-12-21 MED ORDER — IOPAMIDOL (ISOVUE-300) INJECTION 61%
125.0000 mL | Freq: Once | INTRAVENOUS | Status: AC | PRN
  Administered 2015-12-21: 125 mL via INTRAVENOUS

## 2015-12-24 ENCOUNTER — Telehealth (HOSPITAL_COMMUNITY): Payer: Self-pay | Admitting: *Deleted

## 2015-12-24 ENCOUNTER — Other Ambulatory Visit: Payer: Self-pay | Admitting: Family Medicine

## 2015-12-24 DIAGNOSIS — N2889 Other specified disorders of kidney and ureter: Secondary | ICD-10-CM

## 2015-12-24 DIAGNOSIS — Z139 Encounter for screening, unspecified: Secondary | ICD-10-CM

## 2015-12-24 NOTE — Telephone Encounter (Signed)
Left message on voicemail in reference to upcoming appointment scheduled for 12/26/15. Phone number given for a call back so details instructions can be given. Fae Blossom, [place]anae [place]ms

## 2015-12-24 NOTE — Telephone Encounter (Signed)
Patient given detailed instructions per Myocardial Perfusion Study Information Sheet for the test on 12/26/15 at 0715. Patient notified to arrive 15 minutes early and that it is imperative to arrive on time for appointment to keep from having the test rescheduled.  If you need to cancel or reschedule your appointment, please call the office within 24 hours of your appointment. Failure to do so may result in a cancellation of your appointment, and a $50 no show fee. Patient verbalized understanding.Katelynne Revak, Ranae [place]

## 2015-12-26 ENCOUNTER — Ambulatory Visit (HOSPITAL_COMMUNITY): Payer: BLUE CROSS/BLUE SHIELD | Attending: Cardiovascular Disease

## 2015-12-26 DIAGNOSIS — R9439 Abnormal result of other cardiovascular function study: Secondary | ICD-10-CM | POA: Insufficient documentation

## 2015-12-26 DIAGNOSIS — R079 Chest pain, unspecified: Secondary | ICD-10-CM

## 2015-12-26 DIAGNOSIS — R0602 Shortness of breath: Secondary | ICD-10-CM | POA: Insufficient documentation

## 2015-12-26 LAB — MYOCARDIAL PERFUSION IMAGING
Estimated workload: 10.1 METS
Exercise duration (min): 9 min
Exercise duration (sec): 30 s
LV dias vol: 104 mL
LV sys vol: 47 mL
MPHR: 162 {beats}/min
Peak HR: 151 {beats}/min
Percent HR: 93 %
RATE: 0.31
Rest HR: 61 {beats}/min
SDS: 3
SRS: 2
SSS: 5
TID: 1.12

## 2015-12-26 MED ORDER — TECHNETIUM TC 99M SESTAMIBI GENERIC - CARDIOLITE
10.3000 | Freq: Once | INTRAVENOUS | Status: AC | PRN
  Administered 2015-12-26: 10 via INTRAVENOUS

## 2015-12-26 MED ORDER — TECHNETIUM TC 99M SESTAMIBI GENERIC - CARDIOLITE
32.5000 | Freq: Once | INTRAVENOUS | Status: AC | PRN
  Administered 2015-12-26: 33 via INTRAVENOUS

## 2015-12-31 ENCOUNTER — Ambulatory Visit
Admission: RE | Admit: 2015-12-31 | Discharge: 2015-12-31 | Disposition: A | Discharge status: Home / Self Care | Payer: BLUE CROSS/BLUE SHIELD | Source: Ambulatory Visit | Attending: Family Medicine | Admitting: Family Medicine

## 2015-12-31 DIAGNOSIS — N2889 Other specified disorders of kidney and ureter: Secondary | ICD-10-CM

## 2015-12-31 DIAGNOSIS — Z139 Encounter for screening, unspecified: Secondary | ICD-10-CM

## 2015-12-31 MED ORDER — GADOBENATE DIMEGLUMINE 529 MG/ML IV SOLN
19.0000 mL | Freq: Once | INTRAVENOUS | Status: AC | PRN
  Administered 2015-12-31: 19 mL via INTRAVENOUS

## 2016-01-08 ENCOUNTER — Telehealth: Payer: Self-pay | Admitting: Cardiology

## 2016-01-08 NOTE — Telephone Encounter (Signed)
Called the patient to discuss results of stress testing.  Told him that he had a low risk study.  He said that he would continue taking aspirin due to his strong family history.    Tayen Narang Curt Bears, MD 01/08/2016 1:00 PM

## 2016-02-18 DIAGNOSIS — R3915 Urgency of urination: Secondary | ICD-10-CM | POA: Diagnosis not present

## 2016-02-18 DIAGNOSIS — Z125 Encounter for screening for malignant neoplasm of prostate: Secondary | ICD-10-CM | POA: Diagnosis not present

## 2016-02-18 DIAGNOSIS — N281 Cyst of kidney, acquired: Secondary | ICD-10-CM | POA: Diagnosis not present

## 2016-02-18 DIAGNOSIS — D3001 Benign neoplasm of right kidney: Secondary | ICD-10-CM | POA: Diagnosis not present

## 2016-02-18 DIAGNOSIS — Z0000 Encounter for general adult medical examination without abnormal findings: Secondary | ICD-10-CM | POA: Diagnosis not present

## 2016-02-22 DIAGNOSIS — M5386 Other specified dorsopathies, lumbar region: Secondary | ICD-10-CM | POA: Diagnosis not present

## 2016-02-22 DIAGNOSIS — M5032 Other cervical disc degeneration, mid-cervical region, unspecified level: Secondary | ICD-10-CM | POA: Diagnosis not present

## 2016-02-22 DIAGNOSIS — M9902 Segmental and somatic dysfunction of thoracic region: Secondary | ICD-10-CM | POA: Diagnosis not present

## 2016-04-29 DIAGNOSIS — G4733 Obstructive sleep apnea (adult) (pediatric): Secondary | ICD-10-CM | POA: Diagnosis not present

## 2016-06-19 DIAGNOSIS — G8929 Other chronic pain: Secondary | ICD-10-CM | POA: Diagnosis not present

## 2016-06-19 DIAGNOSIS — M25562 Pain in left knee: Secondary | ICD-10-CM | POA: Diagnosis not present

## 2016-06-25 DIAGNOSIS — L570 Actinic keratosis: Secondary | ICD-10-CM | POA: Diagnosis not present

## 2016-06-25 DIAGNOSIS — X32XXXA Exposure to sunlight, initial encounter: Secondary | ICD-10-CM | POA: Diagnosis not present

## 2016-06-25 DIAGNOSIS — D225 Melanocytic nevi of trunk: Secondary | ICD-10-CM | POA: Diagnosis not present

## 2016-06-30 DIAGNOSIS — M25562 Pain in left knee: Secondary | ICD-10-CM | POA: Diagnosis not present

## 2016-07-03 DIAGNOSIS — M2242 Chondromalacia patellae, left knee: Secondary | ICD-10-CM | POA: Diagnosis not present

## 2016-07-03 DIAGNOSIS — M25562 Pain in left knee: Secondary | ICD-10-CM | POA: Diagnosis not present

## 2016-07-25 DIAGNOSIS — Z0000 Encounter for general adult medical examination without abnormal findings: Secondary | ICD-10-CM | POA: Diagnosis not present

## 2016-07-25 DIAGNOSIS — Z23 Encounter for immunization: Secondary | ICD-10-CM | POA: Diagnosis not present

## 2016-07-31 DIAGNOSIS — Z1159 Encounter for screening for other viral diseases: Secondary | ICD-10-CM | POA: Diagnosis not present

## 2016-07-31 DIAGNOSIS — E7800 Pure hypercholesterolemia, unspecified: Secondary | ICD-10-CM | POA: Diagnosis not present

## 2017-02-20 IMAGING — NM NM MISC PROCEDURE
6 series · 36 of 36 positions shown · non-contrast
Comparison: none

[Series 1: stress-gsp · 6.40mm/px · 6 of 510 frames shown]
[frame 43/510  full-range]
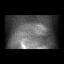
[frame 128/510  full-range]
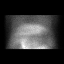
[frame 213/510  full-range]
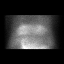
[frame 298/510  full-range]
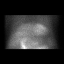
[frame 383/510  full-range]
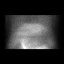
[frame 468/510  full-range]
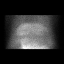

[Series 1: wbr_r-proj_st rest · 6.40mm/px · 6 of 64 frames shown]
[frame 6/64]
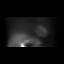
[frame 16/64]
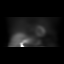
[frame 27/64]
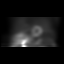
[frame 38/64]
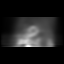
[frame 48/64]
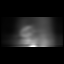
[frame 59/64]
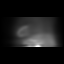

[Series 1: stress-sum-em · 6.40mm/px · 6 of 64 frames shown]
[frame 6/64]
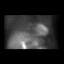
[frame 16/64]
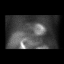
[frame 27/64]
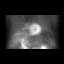
[frame 38/64]
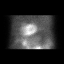
[frame 48/64]
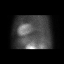
[frame 59/64]
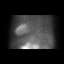

[Series 1: wbr_s-proj_st stress-gsp · 6.40mm/px · 6 of 512 frames shown]
[frame 43/512]
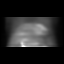
[frame 128/512]
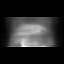
[frame 214/512]
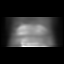
[frame 299/512]
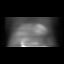
[frame 384/512]
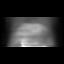
[frame 470/512]
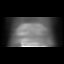

[Series 1: wbr_s-proj_st stress-sum-em · 6.40mm/px · 6 of 64 frames shown]
[frame 6/64]
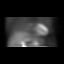
[frame 16/64]
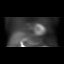
[frame 27/64]
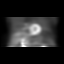
[frame 38/64]
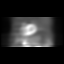
[frame 48/64]
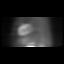
[frame 59/64]
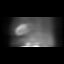

[Series 1: rest · 6.40mm/px · 6 of 64 frames shown]
[frame 6/64]
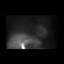
[frame 16/64]
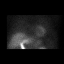
[frame 27/64]
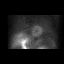
[frame 38/64]
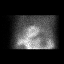
[frame 48/64]
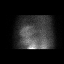
[frame 59/64]
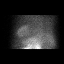

[36 of 36 positions shown; findings below may reference images not displayed]

Canned report from images found in remote index.

Refer to host system for actual result text.

## 2017-11-23 DIAGNOSIS — Z0001 Encounter for general adult medical examination with abnormal findings: Secondary | ICD-10-CM | POA: Diagnosis not present

## 2017-11-23 DIAGNOSIS — E7800 Pure hypercholesterolemia, unspecified: Secondary | ICD-10-CM | POA: Diagnosis not present

## 2017-11-23 DIAGNOSIS — Z125 Encounter for screening for malignant neoplasm of prostate: Secondary | ICD-10-CM | POA: Diagnosis not present

## 2018-03-09 ENCOUNTER — Encounter: Payer: Self-pay | Admitting: Gastroenterology

## 2018-11-25 DIAGNOSIS — Z125 Encounter for screening for malignant neoplasm of prostate: Secondary | ICD-10-CM | POA: Diagnosis not present

## 2018-11-25 DIAGNOSIS — Z0000 Encounter for general adult medical examination without abnormal findings: Secondary | ICD-10-CM | POA: Diagnosis not present

## 2018-11-25 DIAGNOSIS — E7800 Pure hypercholesterolemia, unspecified: Secondary | ICD-10-CM | POA: Diagnosis not present

## 2023-02-17 DIAGNOSIS — Z0000 Encounter for general adult medical examination without abnormal findings: Secondary | ICD-10-CM | POA: Diagnosis not present

## 2023-02-17 DIAGNOSIS — E7800 Pure hypercholesterolemia, unspecified: Secondary | ICD-10-CM | POA: Diagnosis not present

## 2023-02-17 DIAGNOSIS — Z125 Encounter for screening for malignant neoplasm of prostate: Secondary | ICD-10-CM | POA: Diagnosis not present

## 2023-02-17 DIAGNOSIS — Z23 Encounter for immunization: Secondary | ICD-10-CM | POA: Diagnosis not present

## 2023-02-18 ENCOUNTER — Other Ambulatory Visit: Payer: Self-pay | Admitting: Family Medicine

## 2023-02-18 DIAGNOSIS — Z136 Encounter for screening for cardiovascular disorders: Secondary | ICD-10-CM

## 2023-04-10 ENCOUNTER — Ambulatory Visit
Admission: RE | Admit: 2023-04-10 | Discharge: 2023-04-10 | Disposition: A | Discharge status: Home / Self Care | Payer: BLUE CROSS/BLUE SHIELD | Source: Ambulatory Visit | Attending: Family Medicine | Admitting: Family Medicine

## 2023-04-10 DIAGNOSIS — Z136 Encounter for screening for cardiovascular disorders: Secondary | ICD-10-CM

## 2023-06-01 DIAGNOSIS — K5730 Diverticulosis of large intestine without perforation or abscess without bleeding: Secondary | ICD-10-CM | POA: Diagnosis not present

## 2023-06-01 DIAGNOSIS — Z86010 Personal history of colonic polyps: Secondary | ICD-10-CM | POA: Diagnosis not present

## 2023-06-01 DIAGNOSIS — D123 Benign neoplasm of transverse colon: Secondary | ICD-10-CM | POA: Diagnosis not present

## 2023-06-01 DIAGNOSIS — Z09 Encounter for follow-up examination after completed treatment for conditions other than malignant neoplasm: Secondary | ICD-10-CM | POA: Diagnosis not present

## 2024-02-19 DIAGNOSIS — G4733 Obstructive sleep apnea (adult) (pediatric): Secondary | ICD-10-CM | POA: Diagnosis not present

## 2024-02-19 DIAGNOSIS — E7800 Pure hypercholesterolemia, unspecified: Secondary | ICD-10-CM | POA: Diagnosis not present

## 2024-02-19 DIAGNOSIS — I7789 Other specified disorders of arteries and arterioles: Secondary | ICD-10-CM | POA: Diagnosis not present

## 2024-02-19 DIAGNOSIS — N529 Male erectile dysfunction, unspecified: Secondary | ICD-10-CM | POA: Diagnosis not present

## 2024-02-19 DIAGNOSIS — Z0000 Encounter for general adult medical examination without abnormal findings: Secondary | ICD-10-CM | POA: Diagnosis not present

## 2024-02-19 DIAGNOSIS — Z125 Encounter for screening for malignant neoplasm of prostate: Secondary | ICD-10-CM | POA: Diagnosis not present

## 2024-02-19 DIAGNOSIS — B351 Tinea unguium: Secondary | ICD-10-CM | POA: Diagnosis not present

## 2024-02-19 DIAGNOSIS — Z6826 Body mass index (BMI) 26.0-26.9, adult: Secondary | ICD-10-CM | POA: Diagnosis not present

## 2024-05-27 DIAGNOSIS — B351 Tinea unguium: Secondary | ICD-10-CM | POA: Diagnosis not present
